# Patient Record
Sex: Male | Born: 2000 | Hispanic: No | Marital: Single | Smoking: Never smoker
Health system: Southern US, Community
[De-identification: ages and names within clinical notes are randomized; demographics above are authoritative.]

## PROBLEM LIST (undated history)

## (undated) DIAGNOSIS — Z68.41 Body mass index (BMI) pediatric, 85th percentile to less than 95th percentile for age: Secondary | ICD-10-CM

## (undated) DIAGNOSIS — Z0101 Encounter for examination of eyes and vision with abnormal findings: Secondary | ICD-10-CM

## (undated) DIAGNOSIS — Z789 Other specified health status: Secondary | ICD-10-CM

## (undated) HISTORY — DX: Encounter for examination of eyes and vision with abnormal findings: Z01.01

## (undated) HISTORY — DX: Other specified health status: Z78.9

## (undated) HISTORY — DX: Body mass index (bmi) pediatric, 85th percentile to less than 95th percentile for age: Z68.53

---
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
## 2019-11-23 ENCOUNTER — Ambulatory Visit: Payer: Self-pay | Attending: Internal Medicine

## 2019-11-23 ENCOUNTER — Other Ambulatory Visit: Payer: Self-pay | Admitting: *Deleted

## 2019-11-23 DIAGNOSIS — Z20822 Contact with and (suspected) exposure to covid-19: Secondary | ICD-10-CM

## 2019-11-24 LAB — NOVEL CORONAVIRUS, NAA: SARS-CoV-2, NAA: NOT DETECTED

## 2019-11-24 LAB — SARS-COV-2, NAA 2 DAY TAT

## 2021-03-11 ENCOUNTER — Other Ambulatory Visit: Payer: Self-pay

## 2021-03-11 ENCOUNTER — Ambulatory Visit (HOSPITAL_COMMUNITY)
Admission: EM | Admit: 2021-03-11 | Discharge: 2021-03-11 | Disposition: A | Discharge status: Home / Self Care | Payer: Self-pay | Attending: Internal Medicine | Admitting: Internal Medicine

## 2021-03-11 ENCOUNTER — Encounter (HOSPITAL_COMMUNITY): Payer: Self-pay

## 2021-03-11 DIAGNOSIS — J111 Influenza due to unidentified influenza virus with other respiratory manifestations: Secondary | ICD-10-CM

## 2021-03-11 DIAGNOSIS — R509 Fever, unspecified: Secondary | ICD-10-CM

## 2021-03-11 MED ORDER — ACETAMINOPHEN 325 MG PO TABS
ORAL_TABLET | ORAL | Status: AC
  Filled 2021-03-11: qty 2

## 2021-03-11 MED ORDER — ONDANSETRON 8 MG PO TBDP: 8.0000 mg | ORAL_TABLET | Freq: Three times a day (TID) | ORAL | 0 refills | Status: AC | PRN

## 2021-03-11 MED ORDER — ACETAMINOPHEN 325 MG PO TABS
650.0000 mg | ORAL_TABLET | Freq: Once | ORAL | Status: AC
  Administered 2021-03-11: 650 mg via ORAL

## 2021-03-11 NOTE — ED Provider Notes (Signed)
[place]    CSN: [id number] Arrival date & time: 03/11/21  1726      History   Chief Complaint Chief Complaint  Patient presents with   Cough   Nasal Congestion   Nausea    HPI Joseph Joyce is a 20 y.o. male presenting with fevers/chills, bodyaches, headaches x3 days. Medical history noncontributory.  Fevers and chills, has not monitored temperature at home.  Taking various over-the-counter medications without relief.  Nausea with 2 episodes of bilious vomiting today, generalized crampy abdominal pain, intermittent watery diarrhea.  Nonproductive cough.  Generalized body aches.  Unable to tolerate fluids or food.  HPI  Past Medical History:  Diagnosis Date   Body mass index, pediatric, 85th percentile to less than 95th percentile for age 59/06/2012   Failed vision screen 12/08/2013   Medical history non-contributory     Patient Active Problem List   Diagnosis Date Noted   Elevated BP 01/19/2016   Osgood-Schlatter's disease 07/06/2014   Failed vision screen 12/08/2013   Body mass index, pediatric, 85th percentile to less than 95th percentile for age 64/06/2012    History reviewed. No pertinent surgical history.     Home Medications    Prior to Admission medications   Medication Sig Start Date End Date Taking? Authorizing Provider  mupirocin ointment (BACTROBAN) 2 % Apply 1 application topically 2 (two) times daily. 07/13/17   Ok Edwards, PA-C    Family History Family History  Problem Relation Age of Onset   Obesity Mother    Gallstones Maternal Grandfather    Asthma Sister    Hyperlipidemia Brother     Social History Social History   Tobacco Use   Smoking status: Never   Smokeless tobacco: Never  Vaping Use   Vaping Use: Never used  Substance Use Topics   Alcohol use: No   Drug use: No     Allergies   Patient has no known allergies.   Review of Systems Review of Systems  Constitutional:  Positive for chills and fever.  Negative for appetite change.  HENT:  Positive for congestion. Negative for ear pain, rhinorrhea, sinus pressure, sinus pain and sore throat.   Eyes:  Negative for redness and visual disturbance.  Respiratory:  Positive for cough. Negative for chest tightness, shortness of breath and wheezing.   Cardiovascular:  Negative for chest pain and palpitations.  Gastrointestinal:  Positive for diarrhea and vomiting. Negative for abdominal pain, constipation and nausea.  Genitourinary:  Negative for dysuria, frequency and urgency.  Musculoskeletal:  Positive for myalgias.  Neurological:  Negative for dizziness, weakness and headaches.  Psychiatric/Behavioral:  Negative for confusion.   All other systems reviewed and are negative.   Physical Exam Triage Vital Signs ED Triage Vitals  Enc Vitals Group     BP --      Pulse Rate 03/11/21 1902 (!) 125     Resp 03/11/21 1902 16     Temp 03/11/21 1902 (!) 103.3 F (39.6 C)     Temp Source 03/11/21 1902 Oral     SpO2 03/11/21 1902 98 %     Weight --      Height --      Head Circumference --      Peak Flow --      Pain Score 03/11/21 1901 0     Pain Loc --      Pain Edu? --      Excl. in [place]? --    No data found.  Updated Vital Signs Pulse (!) 125   Temp (!) 103.3 F (39.6 C) (Oral)   Resp 16   SpO2 98%   Visual Acuity Right Eye Distance:   Left Eye Distance:   Bilateral Distance:    Right Eye Near:   Left Eye Near:    Bilateral Near:     Physical Exam Vitals reviewed.  Constitutional:      General: He is not in acute distress.    Appearance: Normal appearance. He is ill-appearing.  HENT:     Head: Normocephalic and atraumatic.     Right Ear: Tympanic membrane, ear canal and external ear normal. No tenderness. No middle ear effusion. There is no impacted cerumen. Tympanic membrane is not perforated, erythematous, retracted or bulging.     Left Ear: Tympanic membrane, ear canal and external ear normal. No tenderness.  No middle  ear effusion. There is no impacted cerumen. Tympanic membrane is not perforated, erythematous, retracted or bulging.     Nose: Nose normal. No congestion.     Mouth/Throat:     Mouth: Mucous membranes are moist.     Pharynx: Uvula midline. No oropharyngeal exudate or posterior oropharyngeal erythema.  Eyes:     Extraocular Movements: Extraocular movements intact.     Pupils: Pupils are equal, round, and reactive to light.  Cardiovascular:     Rate and Rhythm: Regular rhythm. Tachycardia present.     Heart sounds: Normal heart sounds.  Pulmonary:     Effort: Pulmonary effort is normal.     Breath sounds: Normal breath sounds. No decreased breath sounds, wheezing, rhonchi or rales.  Abdominal:     Palpations: Abdomen is soft.     Tenderness: There is no abdominal tenderness. There is no guarding or rebound.  Lymphadenopathy:     Cervical: No cervical adenopathy.     Right cervical: No superficial cervical adenopathy.    Left cervical: No superficial cervical adenopathy.  Neurological:     General: No focal deficit present.     Mental Status: He is alert and oriented to person, place, and time.  Psychiatric:        Mood and Affect: Mood normal.        Behavior: Behavior normal.        Thought Content: Thought content normal.        Judgment: Judgment normal.     UC Treatments / Results  Labs (all labs ordered are listed, but only abnormal results are displayed) Labs Reviewed - No data to display  EKG   Radiology No results found.  Procedures Procedures (including critical care time)  Medications Ordered in UC Medications  acetaminophen (TYLENOL) tablet 650 mg (650 mg Oral Given 03/11/21 1909)    Initial Impression / Assessment and Plan / UC Course  I have reviewed the triage vital signs and the nursing notes.  Pertinent labs & imaging results that were available during my care of the patient were reviewed by me and considered in my medical decision making (see chart  for details).     This patient is a very pleasant 20 y.o. year old male presenting with influenza.  Febrile and tachycardic.  Tylenol administered.   Zofarn ODT sent. Good hydration.  Strict ED return precautions discussed. Patient verbalizes understanding and agreement.   Coding Level 4 for acute illness with systemic symptoms, and prescription drug management   Final Clinical Impressions(s) / UC Diagnoses   Final diagnoses:  Influenza with respiratory manifestation  Febrile illness  Discharge Instructions       -For fevers/chills, bodyaches, headaches- You can take Tylenol up to 1000 mg 3 times daily, and ibuprofen up to 600 mg 3 times daily with food.  You can take these together, or alternate every 3-4 hours. -Drink plenty of water/gatorade and get plenty of rest -With a virus, you're typically contagious for 5-7 days, or as long as you're having fevers.  -Come back and see [place] if things are getting worse instead of better, like shortness of breath, chest pain, fevers and chills that are getting higher instead of lower and do not come down with Tylenol or ibuprofen, etc.    ED Prescriptions   None    PDMP not reviewed this encounter.   Hazel Sams, PA-C 03/11/21 1927

## 2021-03-11 NOTE — Discharge Instructions (Addendum)
-  Take the Zofran (ondansetron) up to 3 times daily for nausea and vomiting. Dissolve one pill under your tongue or between your teeth and your cheek. -For fevers/chills, bodyaches, headaches- You can take Tylenol up to 1000 mg 3 times daily, and ibuprofen up to 600 mg 3 times daily with food.  You can take these together, or alternate every 3-4 hours. -Drink plenty of water/gatorade and get plenty of rest -With a virus, you're typically contagious for 5-7 days, or as long as you're having fevers.  -Come back and see [place] if things are getting worse instead of better, like shortness of breath, chest pain, fevers and chills that are getting higher instead of lower and do not come down with Tylenol or ibuprofen, etc.

## 2021-03-11 NOTE — ED Triage Notes (Signed)
Pt presents with a fever x 3 days. States he has a cough, vomiting and diarrhea.
